# Patient Record
Sex: Female | Born: 1970 | Race: White | Hispanic: No | Smoking: Current every day smoker
Health system: Southern US, Community
[De-identification: ages and names within clinical notes are randomized; demographics above are authoritative.]

## PROBLEM LIST (undated history)

## (undated) DIAGNOSIS — F209 Schizophrenia, unspecified: Secondary | ICD-10-CM

## (undated) DIAGNOSIS — L0292 Furuncle, unspecified: Secondary | ICD-10-CM

## (undated) DIAGNOSIS — M5430 Sciatica, unspecified side: Secondary | ICD-10-CM

## (undated) DIAGNOSIS — F419 Anxiety disorder, unspecified: Secondary | ICD-10-CM

## (undated) DIAGNOSIS — F319 Bipolar disorder, unspecified: Secondary | ICD-10-CM

## (undated) DIAGNOSIS — L0293 Carbuncle, unspecified: Secondary | ICD-10-CM

## (undated) DIAGNOSIS — F32A Depression, unspecified: Secondary | ICD-10-CM

## (undated) DIAGNOSIS — F329 Major depressive disorder, single episode, unspecified: Secondary | ICD-10-CM

## (undated) DIAGNOSIS — G4700 Insomnia, unspecified: Secondary | ICD-10-CM

## (undated) DIAGNOSIS — E079 Disorder of thyroid, unspecified: Secondary | ICD-10-CM

## (undated) HISTORY — PX: TUBAL LIGATION: SHX77

---

## 2000-06-06 ENCOUNTER — Emergency Department (HOSPITAL_COMMUNITY): Admission: EM | Admit: 2000-06-06 | Discharge: 2000-06-07 | Payer: Self-pay | Admitting: Emergency Medicine

## 2001-10-28 ENCOUNTER — Inpatient Hospital Stay (HOSPITAL_COMMUNITY): Admission: EM | Admit: 2001-10-28 | Discharge: 2001-10-30 | Payer: Self-pay | Admitting: Emergency Medicine

## 2001-12-03 ENCOUNTER — Ambulatory Visit (HOSPITAL_COMMUNITY): Admission: RE | Admit: 2001-12-03 | Discharge: 2001-12-03 | Payer: Self-pay | Admitting: Obstetrics & Gynecology

## 2004-02-13 ENCOUNTER — Emergency Department (HOSPITAL_COMMUNITY): Admission: EM | Admit: 2004-02-13 | Discharge: 2004-02-13 | Payer: Self-pay | Admitting: Emergency Medicine

## 2004-03-17 ENCOUNTER — Emergency Department (HOSPITAL_COMMUNITY): Admission: EM | Admit: 2004-03-17 | Discharge: 2004-03-17 | Payer: Self-pay | Admitting: Emergency Medicine

## 2004-05-08 ENCOUNTER — Emergency Department (HOSPITAL_COMMUNITY): Admission: EM | Admit: 2004-05-08 | Discharge: 2004-05-08 | Payer: Self-pay | Admitting: Emergency Medicine

## 2004-08-06 ENCOUNTER — Emergency Department (HOSPITAL_COMMUNITY): Admission: EM | Admit: 2004-08-06 | Discharge: 2004-08-06 | Payer: Self-pay | Admitting: Emergency Medicine

## 2004-12-01 ENCOUNTER — Emergency Department (HOSPITAL_COMMUNITY): Admission: EM | Admit: 2004-12-01 | Discharge: 2004-12-01 | Payer: Self-pay | Admitting: Emergency Medicine

## 2005-02-16 ENCOUNTER — Emergency Department (HOSPITAL_COMMUNITY): Admission: EM | Admit: 2005-02-16 | Discharge: 2005-02-16 | Payer: Self-pay | Admitting: Emergency Medicine

## 2005-03-07 ENCOUNTER — Emergency Department (HOSPITAL_COMMUNITY): Admission: EM | Admit: 2005-03-07 | Discharge: 2005-03-07 | Payer: Self-pay | Admitting: Emergency Medicine

## 2006-12-08 ENCOUNTER — Emergency Department (HOSPITAL_COMMUNITY): Admission: EM | Admit: 2006-12-08 | Discharge: 2006-12-08 | Payer: Self-pay | Admitting: Emergency Medicine

## 2006-12-18 ENCOUNTER — Emergency Department (HOSPITAL_COMMUNITY): Admission: EM | Admit: 2006-12-18 | Discharge: 2006-12-18 | Payer: Self-pay | Admitting: Emergency Medicine

## 2007-01-13 ENCOUNTER — Emergency Department (HOSPITAL_COMMUNITY): Admission: EM | Admit: 2007-01-13 | Discharge: 2007-01-13 | Payer: Self-pay | Admitting: Emergency Medicine

## 2009-09-05 ENCOUNTER — Emergency Department (HOSPITAL_COMMUNITY): Admission: EM | Admit: 2009-09-05 | Discharge: 2009-09-05 | Payer: Self-pay | Admitting: Emergency Medicine

## 2010-07-06 NOTE — H&P (Signed)
   NAME:  Natalie Moore, Natalie Moore                               ACCOUNT NO.:  [id number]   MEDICAL RECORD NO.:  [id number]                   PATIENT TYPE:  OIB   LOCATION:  A415                                 FACILITY:  [facility]   PHYSICIAN:  Tilda Burrow, M.D.              DATE OF BIRTH:  [birth year]   DATE OF ADMISSION:  10/28/2001  DATE OF DISCHARGE:                                HISTORY & PHYSICAL   HISTORY OF PRESENT ILLNESS:  The patient is a 40 year old gravida 6, para 3,  abortus 2, who is at 40 weeks 3 days, who presented early this morning with  contractions.  They have now become regular, two to four minutes apart, and  are still mild, with maternal discomfort but no cervical change.   Prenatal care has been limited due to transportation difficulties.  Blood  type is A negative.  Rubella is pending.  Hepatitis B surface antigen is  negative.  HIV is negative.  GC and Chlamydia are pending.  GBS is negative.  Twenty-eight week hemoglobin was 10.7.  One-hour glucose tolerance was  within normal limits.   PAST MEDICAL HISTORY:  Positive for migraines.   PAST SURGICAL HISTORY:  Positive for a D&C and a laparoscope.   ALLERGIES:  She has an allergy to CODEINE, it makes her nauseous.   SOCIAL HISTORY:  She is single.  She lives with her parents.   PHYSICAL EXAMINATION:  VITAL SIGNS:  Stable.  PELVIC:  Cervix is 1 cm, about 60% effaced, and -2 station.   PLAN:  We are going to admit and observe and if no change in cervix by later  on in the morning, will augment her labor.      Zerita Boers, Reita Cliche, M.D.    DL/MEDQ  D:  64/40/3474  T:  10/28/2001  Job:  [id number]

## 2010-07-06 NOTE — H&P (Signed)
   [name], Natalie Moore                               ACCOUNT NO.:  [id number]   MEDICAL RECORD NO.:  [id number]                   PATIENT TYPE:  INP   LOCATION:  A416                                 FACILITY:  [facility]   PHYSICIAN:  Tilda Burrow, M.D.              DATE OF BIRTH:  29-Jun-1970   DATE OF ADMISSION:  10/28/2001  DATE OF DISCHARGE:                                HISTORY & PHYSICAL   DATE OF DELIVERY:  October 28, 2001 at 1355 p.m.   ONSET OF LABOR:  October 28, 2001 at 12 p.m.   LENGTH OF FIRST STAGE LABOR:  One hour and 50 minutes.   LENGTH OF SECOND STAGE LABOR:  Five minutes.   LENGTH OF THIRD STAGE LABOR:  Six minutes.   DELIVERY NOTE:  The patient had a normal spontaneous delivery of a viable  female infant.  Upon delivery of head there was a large gush of light  meconium fluid.  Infant was thoroughly suctioned and had spontaneous  delivery with good tone, strong cry, vigorous movement of all extremities.  Infant was suctioned, again, thoroughly and dried.  Cord clamped and infant  placed on mother's abdomen in good condition.  Cord blood gas and cord blood  was obtained and sent to laboratory.  Upon inspection perineum was noted to  be intact.  Pitocin 20 units and 1000 cc of D5 LR was used to manage third  stage of labor.  Placenta was delivered spontaneously via Schultze mechanism  with a three vessel cord noted upon inspection.  Membranes were stained  yellow and placenta was sent to pathology for inspection.  Estimated blood  loss was approximately 350 cc.  Infant and mother tolerated delivery well.  Both stabilized and transferred out to the postpartum unit in good  condition.     Zerita Boers, C.N.M.                    Tilda Burrow, M.D.    DL/MEDQ  D:  45/40/9811  T:  10/28/2001  Job:  [id number]   cc:   [facility]

## 2010-07-06 NOTE — Op Note (Signed)
   NAME:  Natalie Moore, Natalie Moore                               ACCOUNT NO.:  [id number]   MEDICAL RECORD NO.:  [id number]                   PATIENT TYPE:  AMB   LOCATION:  DAY                                  FACILITY:  [facility]   PHYSICIAN:  Lazaro Arms, M.D.                DATE OF BIRTH:  11/22/1970   DATE OF PROCEDURE:  DATE OF DISCHARGE:                                 OPERATIVE REPORT   PREOPERATIVE DIAGNOSIS:  Multiparous female desires sterilization.   POSTOPERATIVE DIAGNOSIS:  Multiparous female desires sterilization.   PROCEDURE:  Laparoscopic bilateral tubal ligation using electrocautery.   SURGEON:  Lazaro Arms, M.D.   ANESTHESIA:  General endotracheal   FINDINGS:  The patient had a normal uterus, tubes, and ovaries, normal  pelvis.   DESCRIPTION OF PROCEDURE:  The patient was taken to the operating room and  placed in the supine position where she underwent general endotracheal  anesthesia.  She was then prepped and draped in the usual sterile fashion.  She was then placed in dorsal lithotomy position; Hulka tenaculum was  placed; and her bladder was drained.  An incision made under the umbilicus  and dissected down to the fascia which was incised sharply; then dissected  to the peritoneum and this was opened sharply under direct visualization  without difficulty.   Then an 11-mm trocar was placed under direct visualization with no  obturator, just the trocar, into the peritoneal cavity.  The peritoneal  cavity was insufflated and confirmed with the video laparoscope.  The tubes  were identified and the distal end burned to the ampullary region of the  tube, an approximately 3-cm segment bilaterally, with no resistance and  beyond using the electrocautery unit.  The pelvis was otherwise normal, as  stated above.  There was good hemostasis.   The instruments were removed.  The gas was allowed to escape.  The fascia  was closed with a single #0 Vicryl suture.  The subcutaneous  tissue was  closed with 3-0 Vicryl and Dermabond was placed.  Marcaine 1/2% with  1:200,000 epinephrine was injected at the surgical site for postoperative  pain relief.  The patient was awakened from anesthesia taken to the recovery  room in good stable condition.  All counts were correct.                                               Lazaro Arms, M.D.    Natalie Moore  D:  12/03/2001  T:  12/03/2001  Job:  [id number]

## 2013-02-18 DIAGNOSIS — F319 Bipolar disorder, unspecified: Secondary | ICD-10-CM

## 2013-02-18 DIAGNOSIS — F209 Schizophrenia, unspecified: Secondary | ICD-10-CM

## 2013-02-18 HISTORY — DX: Schizophrenia, unspecified: F20.9

## 2013-02-18 HISTORY — DX: Bipolar disorder, unspecified: F31.9

## 2013-12-03 ENCOUNTER — Emergency Department (HOSPITAL_COMMUNITY)
Admission: EM | Admit: 2013-12-03 | Discharge: 2013-12-03 | Disposition: A | Discharge status: Home / Self Care | Payer: Self-pay | Attending: Emergency Medicine | Admitting: Emergency Medicine

## 2013-12-03 ENCOUNTER — Encounter (HOSPITAL_COMMUNITY): Payer: Self-pay | Admitting: Emergency Medicine

## 2013-12-03 DIAGNOSIS — Z720 Tobacco use: Secondary | ICD-10-CM | POA: Insufficient documentation

## 2013-12-03 DIAGNOSIS — L02214 Cutaneous abscess of groin: Secondary | ICD-10-CM | POA: Insufficient documentation

## 2013-12-03 DIAGNOSIS — Z9851 Tubal ligation status: Secondary | ICD-10-CM | POA: Insufficient documentation

## 2013-12-03 HISTORY — DX: Furuncle, unspecified: L02.92

## 2013-12-03 HISTORY — DX: Carbuncle, unspecified: L02.93

## 2013-12-03 MED ORDER — SULFAMETHOXAZOLE-TRIMETHOPRIM 800-160 MG PO TABS: 1.0000 | ORAL_TABLET | Freq: Two times a day (BID) | ORAL | Status: AC

## 2013-12-03 MED ORDER — HYDROCODONE-ACETAMINOPHEN 5-325 MG PO TABS: 1.0000 | ORAL_TABLET | ORAL | Status: AC | PRN

## 2013-12-03 MED ORDER — FLUCONAZOLE 150 MG PO TABS: 150.0000 mg | ORAL_TABLET | Freq: Every day | ORAL | Status: AC

## 2013-12-03 NOTE — ED Notes (Signed)
Pt reports right groin "boil" x3 days. Pt reports history of same. Pt denies any fever,nausea. nad noted.

## 2013-12-03 NOTE — ED Provider Notes (Signed)
CSN: [id number]     Arrival date & time 12/03/13  1537 History   First MD Initiated Contact with Patient 12/03/13 1609     Chief Complaint  Patient presents with  . Recurrent Skin Infections     (Consider location/radiation/quality/duration/timing/severity/associated sxs/prior Treatment) Patient is a 43 y.o. female presenting with abscess. The history is provided by the patient.  Abscess Location:  Ano-genital Ano-genital abscess location:  Groin Abscess quality: painful, redness and warmth   Red streaking: no   Duration:  3 days Progression:  Worsening Pain details:    Quality:  Throbbing  Natalie Moore is a 43 y.o. female who presents to the ED with an abscess to the right groin that started about 5 days days ago. The area became painful 3 days ago. She sat in warm water today without relief. She has been taking ibuprofen 800 mg but it only helps for a while. She has had boils in the past that have had to be drained. She denies fever, n/v or other problems.   Past Medical History  Diagnosis Date  . Recurrent boils    Past Surgical History  Procedure Laterality Date  . Tubal ligation     History reviewed. No pertinent family history. History  Substance Use Topics  . Smoking status: Current Every Day Smoker -- 1.00 packs/day  . Smokeless tobacco: Not on file  . Alcohol Use: No   OB History   Grav Para Term Preterm Abortions TAB SAB Ect Mult Living                 Review of Systems Negative except as stated in HPI   Allergies  Review of patient's allergies indicates not on file.  Home Medications   Prior to Admission medications   Not on File   BP 117/68  Pulse 95  Temp(Src) 98.2 F (36.8 C) (Oral)  Resp 18  Ht 5\' 3"  (1.6 m)  Wt 178 lb (80.74 kg)  BMI 31.54 kg/m2  SpO2 99%  LMP 12/02/2013 Physical Exam  Nursing note and vitals reviewed. Constitutional: She is oriented to person, place, and time. She appears well-developed and well-nourished. No  distress.  HENT:  Head: Normocephalic.  Eyes: EOM are normal.  Neck: Neck supple.  Cardiovascular: Normal rate.   Pulmonary/Chest: Effort normal.  Abdominal:    Raised tender area right inguinal area with erythema, no streaking.   Musculoskeletal: Normal range of motion.  Neurological: She is alert and oriented to person, place, and time. No cranial nerve deficit.  Skin: Skin is warm and dry.  Psychiatric: She has a normal mood and affect. Her behavior is normal.    ED Course  Procedures (including critical care time) Labs Review  MDM  43 y.o. female with small draining abscess to the right inguinal area. She will continue to apply warm wet compresses to the area. Will start antibiotics and pain medication and she will return if symptoms worsen. Stable for discharge without fever or red streaking. Discussed with the patient and all questioned fully answered.    Medication List         fluconazole 150 MG tablet  Commonly known as:  DIFLUCAN  Take 1 tablet (150 mg total) by mouth daily.     HYDROcodone-acetaminophen 5-325 MG per tablet  Commonly known as:  NORCO/VICODIN  Take 1 tablet by mouth every 4 (four) hours as needed.     sulfamethoxazole-trimethoprim 800-160 MG per tablet  Commonly known as:  BACTRIM DS,SEPTRA DS  Take 1 tablet by mouth 2 (two) times daily.            Janne NapoleonHope M Margurete Guaman, NP 12/03/13 2013

## 2013-12-03 NOTE — Discharge Instructions (Signed)
Abscess °An abscess (boil or furuncle) is an infected area on or under the skin. This area is filled with yellowish-white fluid (pus) and other material (debris). °HOME CARE  °· Only take medicines as told by your doctor. °· If you were given antibiotic medicine, take it as directed. Finish the medicine even if you start to feel better. °· If gauze is used, follow your doctor's directions for changing the gauze. °· To avoid spreading the infection: °¨ Keep your abscess covered with a bandage. °¨ Wash your hands well. °¨ Do not share personal care items, towels, or whirlpools with others. °¨ Avoid skin contact with others. °· Keep your skin and clothes clean around the abscess. °· Keep all doctor visits as told. °GET HELP RIGHT AWAY IF:  °· You have more pain, puffiness (swelling), or redness in the wound site. °· You have more fluid or blood coming from the wound site. °· You have muscle aches, chills, or you feel sick. °· You have a fever. °MAKE SURE YOU:  °· Understand these instructions. °· Will watch your condition. °· Will get help right away if you are not doing well or get worse. °Document Released: 07/24/2007 Document Revised: 08/06/2011 Document Reviewed: 04/19/2011 °ExitCare® Patient Information ©2015 [employer]. This information is not intended to replace advice given to you by your health care provider. Make sure you discuss any questions you have with your health care provider. ° °

## 2013-12-03 NOTE — ED Provider Notes (Signed)
Medical screening examination/treatment/procedure(s) were performed by non-physician practitioner and as supervising physician I was immediately available for consultation/collaboration.   EKG Interpretation None        Glynn OctaveStephen Hartley Urton, MD 12/03/13 2104

## 2015-01-07 ENCOUNTER — Emergency Department (HOSPITAL_COMMUNITY)
Admission: EM | Admit: 2015-01-07 | Discharge: 2015-01-07 | Discharge status: Against Medical Advice | Payer: Self-pay | Attending: Emergency Medicine | Admitting: Emergency Medicine

## 2015-01-07 ENCOUNTER — Encounter (HOSPITAL_COMMUNITY): Payer: Self-pay | Admitting: *Deleted

## 2015-01-07 DIAGNOSIS — M79661 Pain in right lower leg: Secondary | ICD-10-CM | POA: Insufficient documentation

## 2015-01-07 DIAGNOSIS — Z872 Personal history of diseases of the skin and subcutaneous tissue: Secondary | ICD-10-CM | POA: Insufficient documentation

## 2015-01-07 DIAGNOSIS — M79604 Pain in right leg: Secondary | ICD-10-CM

## 2015-01-07 DIAGNOSIS — F419 Anxiety disorder, unspecified: Secondary | ICD-10-CM | POA: Insufficient documentation

## 2015-01-07 DIAGNOSIS — F17200 Nicotine dependence, unspecified, uncomplicated: Secondary | ICD-10-CM | POA: Insufficient documentation

## 2015-01-07 DIAGNOSIS — Z79899 Other long term (current) drug therapy: Secondary | ICD-10-CM | POA: Insufficient documentation

## 2015-01-07 HISTORY — DX: Anxiety disorder, unspecified: F41.9

## 2015-01-07 LAB — TSH: TSH: 1.744 u[IU]/mL (ref 0.350–4.500)

## 2015-01-07 LAB — BASIC METABOLIC PANEL
Anion gap: 8 (ref 5–15)
BUN: 8 mg/dL (ref 6–20)
CHLORIDE: 101 mmol/L (ref 101–111)
CO2: 28 mmol/L (ref 22–32)
Calcium: 9 mg/dL (ref 8.9–10.3)
Creatinine, Ser: 0.93 mg/dL (ref 0.44–1.00)
GFR calc Af Amer: >60 mL/min (ref >60)
GFR calc non Af Amer: >60 mL/min (ref >60)
GLUCOSE: 68 mg/dL (ref 65–99)
POTASSIUM: 3.8 mmol/L (ref 3.5–5.1)
SODIUM: 137 mmol/L (ref 135–145)

## 2015-01-07 MED ORDER — KETOROLAC TROMETHAMINE 60 MG/2ML IM SOLN
60.0000 mg | Freq: Once | INTRAMUSCULAR | Status: AC
  Administered 2015-01-07: 60 mg via INTRAMUSCULAR
  Filled 2015-01-07: qty 2

## 2015-01-07 MED ORDER — DIAZEPAM 2 MG PO TABS
2.0000 mg | ORAL_TABLET | Freq: Once | ORAL | Status: AC
  Administered 2015-01-07: 2 mg via ORAL
  Filled 2015-01-07: qty 1

## 2015-01-07 MED ORDER — LORAZEPAM 1 MG PO TABS: 1.0000 mg | ORAL_TABLET | Freq: Once | ORAL | Status: DC

## 2015-01-07 NOTE — ED Provider Notes (Signed)
CSN: [id number]     Arrival date & time 01/07/15  1201 History   First MD Initiated Contact with Patient 01/07/15 1229     Chief Complaint  Patient presents with  . Anxiety  . Leg Pain     (Consider location/radiation/quality/duration/timing/severity/associated sxs/prior Treatment) Patient is a 44 y.o. female presenting with anxiety and leg pain. The history is provided by the patient.  Anxiety This is a recurrent problem. The current episode started 3 to 5 hours ago. The problem occurs constantly. The problem has been gradually improving. Associated symptoms include chest pain and shortness of breath. Nothing relieves the symptoms. She has tried nothing for the symptoms. The treatment provided no relief.  Leg Pain Location:  Leg Leg location:  R lower leg Pain details:    Quality:  Aching and cramping   Radiates to:  Does not radiate   Timing:  Intermittent   Progression:  Waxing and waning Chronicity:  Recurrent Prior injury to area:  No Relieved by:  None tried   Past Medical History  Diagnosis Date  . Recurrent boils   . Anxiety    Past Surgical History  Procedure Laterality Date  . Tubal ligation     No family history on file. Social History  Substance Use Topics  . Smoking status: Current Every Day Smoker -- 1.00 packs/day  . Smokeless tobacco: None  . Alcohol Use: No   OB History    No data available     Review of Systems  Respiratory: Positive for shortness of breath. Negative for cough.   Cardiovascular: Positive for chest pain. Negative for palpitations and leg swelling.  Musculoskeletal: Negative for joint swelling and gait problem.  Skin: Negative for rash.  Psychiatric/Behavioral: Positive for agitation. Negative for suicidal ideas.  All other systems reviewed and are negative.     Allergies  Bactrim  Home Medications   Prior to Admission medications   Medication Sig Start Date End Date Taking? Authorizing Provider  fluconazole (DIFLUCAN)  150 MG tablet Take 1 tablet (150 mg total) by mouth daily. 12/03/13   Hope Orlene Och, NP  HYDROcodone-acetaminophen (NORCO/VICODIN) 5-325 MG per tablet Take 1 tablet by mouth every 4 (four) hours as needed. 12/03/13   Hope Orlene Och, NP   BP 96/58 mmHg  Pulse 73  Temp(Src) 97.5 F (36.4 C) (Oral)  Resp 16  Ht  (1.6 m)  Wt 185 lb (83.915 kg)  BMI 32.78 kg/m2  SpO2 99%  LMP 01/02/2015 Physical Exam  Constitutional: She appears well-developed and well-nourished.  HENT:  Head: Normocephalic and atraumatic.  Neck: Normal range of motion.  Cardiovascular: Normal rate and regular rhythm.   Pulmonary/Chest: No stridor. No respiratory distress.  Abdominal: She exhibits no distension.  Musculoskeletal: Normal range of motion. She exhibits no edema (no lower extremity edema) or tenderness (slightly around achilles tendon with dorsiflexion, no calf ttp, no tenderness with plantarflexion).  Neurological: She is alert.  Nursing note and vitals reviewed.   ED Course  Procedures (including critical care time) Labs Review Labs Reviewed  BASIC METABOLIC PANEL  TSH    Imaging Review No results found. I have personally reviewed and evaluated these images and lab results as part of my medical decision-making.   EKG Interpretation   Date/Time:  Saturday January 07 2015 13:00:54 EST Ventricular Rate:  62 PR Interval:  162 QRS Duration: 85 QT Interval:  433 QTC Calculation: 440 R Axis:   38 Text Interpretation:  Sinus rhythm Low voltage, precordial  leads  Borderline T abnormalities, anterior leads Baseline wander in lead(s) V1  Confirmed by [facility] Hospital[facility] MD, Barbara CowerJASON [phone] on 01/07/2015 1:20:56 PM      MDM   Final diagnoses:  Anxiety  Pain of right lower extremity   Doubt DVT. More likely muscular. Will check bmp, tsh and give a dose of valium to help. Also with anxiety similar to previous anxiety attacks. No new/different symptoms. Valium should help that as well.   Patient left  after receiving medicine apparently to take care of children. No emergent causes suspected, I will call her if labs are awry.     Marily MemosJason Jessalyn Hinojosa, MD 01/07/15 [phone]

## 2015-01-07 NOTE — ED Notes (Signed)
Pt left informed nursing staff she needs to get back home to her kids. Pt did not want to wait for lab results. Pt in no acute distress.

## 2015-01-07 NOTE — ED Notes (Signed)
Pt states she became upset this morning and had an anxiety attack. On Buspar for anxiety, per pt. Pt states pain to entire right leg which began 3 weeks ago.

## 2015-01-07 NOTE — ED Notes (Signed)
Patient to nursing station stating shes "leaving because I have a house full of children, tell my nurse".

## 2015-03-29 DIAGNOSIS — Z139 Encounter for screening, unspecified: Secondary | ICD-10-CM

## 2015-04-25 ENCOUNTER — Encounter (HOSPITAL_COMMUNITY): Payer: Self-pay | Admitting: Emergency Medicine

## 2015-04-25 ENCOUNTER — Emergency Department (HOSPITAL_COMMUNITY)
Admission: EM | Admit: 2015-04-25 | Discharge: 2015-04-25 | Disposition: A | Discharge status: Home / Self Care | Payer: Self-pay | Attending: Emergency Medicine | Admitting: Emergency Medicine

## 2015-04-25 DIAGNOSIS — F17200 Nicotine dependence, unspecified, uncomplicated: Secondary | ICD-10-CM | POA: Insufficient documentation

## 2015-04-25 DIAGNOSIS — M79604 Pain in right leg: Secondary | ICD-10-CM | POA: Insufficient documentation

## 2015-04-25 DIAGNOSIS — Z9851 Tubal ligation status: Secondary | ICD-10-CM | POA: Insufficient documentation

## 2015-04-25 DIAGNOSIS — M5431 Sciatica, right side: Secondary | ICD-10-CM

## 2015-04-25 DIAGNOSIS — M5441 Lumbago with sciatica, right side: Secondary | ICD-10-CM | POA: Insufficient documentation

## 2015-04-25 MED ORDER — OXYCODONE-ACETAMINOPHEN 5-325 MG PO TABS
1.0000 | ORAL_TABLET | Freq: Once | ORAL | Status: AC
  Administered 2015-04-25: 1 via ORAL
  Filled 2015-04-25: qty 1

## 2015-04-25 MED ORDER — CYCLOBENZAPRINE HCL 10 MG PO TABS: 10.0000 mg | ORAL_TABLET | Freq: Three times a day (TID) | ORAL | Status: AC | PRN

## 2015-04-25 MED ORDER — OXYCODONE-ACETAMINOPHEN 5-325 MG PO TABS: 1.0000 | ORAL_TABLET | ORAL | Status: AC | PRN

## 2015-04-25 MED ORDER — PREDNISONE 10 MG PO TABS: ORAL_TABLET | ORAL | Status: DC

## 2015-04-25 NOTE — ED Notes (Signed)
Pt states she has been having right leg pain for 2 weeks.  States it hurts from her hip down.

## 2015-04-25 NOTE — Discharge Instructions (Signed)
°  Sciatica °Sciatica is pain, weakness, numbness, or tingling along your sciatic nerve. The nerve starts in the lower back and runs down the back of each leg. Nerve damage or certain conditions pinch or put pressure on the sciatic nerve. This causes the pain, weakness, and other discomforts of sciatica. °HOME CARE  °· Only take medicine as told by your doctor. °· Apply ice to the affected area for 20 minutes. Do this 3-4 times a day for the first 48-72 hours. Then try heat in the same way. °· Exercise, stretch, or do your usual activities if these do not make your pain worse. °· Go to physical therapy as told by your doctor. °· Keep all doctor visits as told. °· Do not wear high heels or shoes that are not supportive. °· Get a firm mattress if your mattress is too soft to lessen pain and discomfort. °GET HELP RIGHT AWAY IF:  °· You cannot control when you poop (bowel movement) or pee (urinate). °· You have more weakness in your lower back, lower belly (pelvis), butt (buttocks), or legs. °· You have redness or puffiness (swelling) of your back. °· You have a burning feeling when you pee. °· You have pain that gets worse when you lie down. °· You have pain that wakes you from your sleep. °· Your pain is worse than past pain. °· Your pain lasts longer than 4 weeks. °· You are suddenly losing weight without reason. °MAKE SURE YOU:  °· Understand these instructions. °· Will watch this condition. °· Will get help right away if you are not doing well or get worse. °  °This information is not intended to replace advice given to you by your health care provider. Make sure you discuss any questions you have with your health care provider. °  °Document Released: 11/14/2007 Document Revised: 10/26/2014 Document Reviewed: 06/16/2011 °Elsevier Interactive Patient Education ©2016 [employer] ° ° °

## 2015-04-28 NOTE — ED Provider Notes (Signed)
CSN: [id number]     Arrival date & time 04/25/15  1722 History   First MD Initiated Contact with Patient 04/25/15 1751     Chief Complaint  Patient presents with  . Leg Pain     (Consider location/radiation/quality/duration/timing/severity/associated sxs/prior Treatment) HPI   Natalie Moore is a 45 y.o. female who presents to the Emergency Department complaining of right sided low back pain for 2 weeks.  She describes a sharp stabbing pai radiating from her right low back into her right thigh to the level of her knee.  Pain is worse worse with movement, improves slightly with rest.  She denies known injury.  She has tried OTC analgesics without relief.  She denies abd pain, fever, chills, urine or bowel changes, numbness or weakness of the LE's.  Past Medical History  Diagnosis Date  . Recurrent boils   . Anxiety    Past Surgical History  Procedure Laterality Date  . Tubal ligation     History reviewed. No pertinent family history. Social History  Substance Use Topics  . Smoking status: Current Every Day Smoker -- 1.00 packs/day  . Smokeless tobacco: None  . Alcohol Use: No   OB History    No data available     Review of Systems  Constitutional: Negative for fever.  Respiratory: Negative for shortness of breath.   Gastrointestinal: Negative for vomiting, abdominal pain and constipation.  Genitourinary: Negative for dysuria, hematuria, flank pain, decreased urine volume and difficulty urinating.  Musculoskeletal: Positive for back pain. Negative for joint swelling.  Skin: Negative for rash.  Neurological: Negative for weakness and numbness.  All other systems reviewed and are negative.     Allergies  Bactrim  Home Medications   Prior to Admission medications   Medication Sig Start Date End Date Taking? Authorizing Provider  cyclobenzaprine (FLEXERIL) 10 MG tablet Take 1 tablet (10 mg total) by mouth 3 (three) times daily as needed. 04/25/15   Ajaya Crutchfield, PA-C   fluconazole (DIFLUCAN) 150 MG tablet Take 1 tablet (150 mg total) by mouth daily. 12/03/13   Hope Orlene Och, NP  HYDROcodone-acetaminophen (NORCO/VICODIN) 5-325 MG per tablet Take 1 tablet by mouth every 4 (four) hours as needed. 12/03/13   Hope Orlene Och, NP  oxyCODONE-acetaminophen (PERCOCET/ROXICET) 5-325 MG tablet Take 1 tablet by mouth every 4 (four) hours as needed. 04/25/15   Adam Demary, PA-C  predniSONE (DELTASONE) 10 MG tablet Take 6 tablets day one, 5 tablets day two, 4 tablets day three, 3 tablets day four, 2 tablets day five, then 1 tablet day six 04/25/15   Nataline Basara, PA-C   BP 123/69 mmHg  Pulse 88  Temp(Src) 98.2 F (36.8 C) (Oral)  Resp 16  Ht  (1.6 m)  Wt 83.462 kg  BMI 32.60 kg/m2  SpO2 100% Physical Exam  Constitutional: She is oriented to person, place, and time. She appears well-developed and well-nourished. No distress.  HENT:  Head: Normocephalic and atraumatic.  Neck: Normal range of motion. Neck supple.  Cardiovascular: Normal rate, regular rhythm, normal heart sounds and intact distal pulses.   No murmur heard. Pulmonary/Chest: Effort normal and breath sounds normal. No respiratory distress.  Abdominal: Soft. She exhibits no distension. There is no tenderness.  Musculoskeletal: She exhibits tenderness. She exhibits no edema.       Lumbar back: She exhibits tenderness and pain. She exhibits normal range of motion, no swelling, no deformity, no laceration and normal pulse.  ttp of the right lumbar paraspinal  muscles.  No spinal tenderness.  DP pulses are brisk and symmetrical.  Distal sensation intact.  Pt has 5/5 strength against resistance of bilateral lower extremities.     Neurological: She is alert and oriented to person, place, and time. She has normal strength. No sensory deficit. She exhibits normal muscle tone. Coordination and gait normal.  Reflex Scores:      Patellar reflexes are 2+ on the right side and 2+ on the left side.      Achilles  reflexes are 2+ on the right side and 2+ on the left side. Skin: Skin is warm and dry. No rash noted.  Nursing note and vitals reviewed.   ED Course  Procedures (including critical care time) Labs Review Labs Reviewed - No data to display  Imaging Review No results found. I have personally reviewed and evaluated these images and lab results as part of my medical decision-making.   EKG Interpretation None      MDM   Final diagnoses:  Sciatica of right side    Pt well appearing, no focal neuro deficits.  No concerning sx's for emergent neurological process.  Pt agrees to symptomatic tx and close PMD f/u if needed   Pauline Ausammy Bannon Giammarco, PA-C 04/28/15 1312  Zadie Rhineonald Wickline, MD 04/28/15 1356

## 2015-05-07 ENCOUNTER — Encounter (HOSPITAL_COMMUNITY): Payer: Self-pay | Admitting: *Deleted

## 2015-05-07 ENCOUNTER — Emergency Department (HOSPITAL_COMMUNITY)
Admission: EM | Admit: 2015-05-07 | Discharge: 2015-05-07 | Discharge status: Against Medical Advice | Payer: Self-pay | Attending: Emergency Medicine | Admitting: Emergency Medicine

## 2015-05-07 DIAGNOSIS — M5431 Sciatica, right side: Secondary | ICD-10-CM

## 2015-05-07 DIAGNOSIS — M5441 Lumbago with sciatica, right side: Secondary | ICD-10-CM | POA: Insufficient documentation

## 2015-05-07 DIAGNOSIS — F17210 Nicotine dependence, cigarettes, uncomplicated: Secondary | ICD-10-CM | POA: Insufficient documentation

## 2015-05-07 HISTORY — DX: Sciatica, unspecified side: M54.30

## 2015-05-07 NOTE — ED Notes (Signed)
Patient left examination room yelling at provider. Patient ambulatory with no obvious problems walking left AMA.

## 2015-05-07 NOTE — ED Provider Notes (Signed)
History  By signing my name below, I, Natalie Moore, attest that this documentation has been prepared under the direction and in the presence of Natalie Moore, [place]. Electronically Signed: Karle Moore, ED Scribe. 05/07/2015. 12:09 PM.  Chief Complaint  Patient presents with  . Leg Pain   The history is provided by the patient and medical records. No language interpreter was used.    HPI Comments:  Natalie Moore is a 45 y.o. female with PMHx of sciatica who presents to the Emergency Department complaining of lower right-sided back pain that has gotten worse over the past couple of days. She reports the pain radiates down into her right leg. She reports having an appt with a specialist in [place], [place] next month (06/03/15). She states she has been taking Ibuprofen and Flexeril with minimal relief of the pain. Moving increases the pain. She denies alleviating factors. She denies numbness, tingling or weakness of the lower extremities, bowel or bladder incontinence, fever, chills, nausea, vomiting, saddle anesthesia, bruising or wounds. She denies any trauma, injury or fall. She is a smoker. Pt was seen here 12 days ago and received a prescription for Percocet, Prednisone and Flexeril. Pt is ambulatory without issue.  Past Medical History  Diagnosis Date  . Recurrent boils   . Anxiety   . Sciatica    Past Surgical History  Procedure Laterality Date  . Tubal ligation     No family history on file. Social History  Substance Use Topics  . Smoking status: Current Every Day Smoker -- 1.00 packs/day    Types: Cigarettes  . Smokeless tobacco: None  . Alcohol Use: No   OB History    No data available     Review of Systems  Constitutional: Negative for fever and chills.  Gastrointestinal: Negative for nausea and vomiting.  Genitourinary:       No bowel or bladder incontinence  Musculoskeletal: Positive for myalgias and back pain.  Skin: Negative for color change and wound.   Neurological: Negative for weakness and numbness.    Allergies  Bactrim  Home Medications   Prior to Admission medications   Medication Sig Start Date End Date Taking? Authorizing Provider  cyclobenzaprine (FLEXERIL) 10 MG tablet Take 1 tablet (10 mg total) by mouth 3 (three) times daily as needed. 04/25/15   Tammy Triplett, PA-C  fluconazole (DIFLUCAN) 150 MG tablet Take 1 tablet (150 mg total) by mouth daily. 12/03/13   Hope Orlene Och, NP  HYDROcodone-acetaminophen (NORCO/VICODIN) 5-325 MG per tablet Take 1 tablet by mouth every 4 (four) hours as needed. 12/03/13   Hope Orlene Och, NP  oxyCODONE-acetaminophen (PERCOCET/ROXICET) 5-325 MG tablet Take 1 tablet by mouth every 4 (four) hours as needed. 04/25/15   Tammy Triplett, PA-C  predniSONE (DELTASONE) 10 MG tablet Take 6 tablets day one, 5 tablets day two, 4 tablets day three, 3 tablets day four, 2 tablets day five, then 1 tablet day six 04/25/15   Tammy Triplett, PA-C   Triage Vitals: BP 127/73 mmHg  Pulse 106  Temp(Src) 98.5 F (36.9 C) (Oral)  Resp 16  Ht  (1.6 m)  Wt 183 lb (83.008 kg)  BMI 32.43 kg/m2  SpO2 98%  LMP 04/11/2015 Physical Exam  Constitutional: She is oriented to person, place, and time. She appears well-developed and well-nourished.  HENT:  Head: Normocephalic and atraumatic.  Eyes: EOM are normal.  Neck: Normal range of motion.  Cardiovascular: Normal rate.   Pulmonary/Chest: Effort normal.  Musculoskeletal: Normal range of  motion. She exhibits tenderness.  Tender at right sciatic notch. Full ROM. Normal gait.  Neurological: She is alert and oriented to person, place, and time.  Skin: Skin is warm and dry.  Psychiatric: She has a normal mood and affect. Her behavior is normal.  Nursing note and vitals reviewed.   ED Course  Procedures (including critical care time) DIAGNOSTIC STUDIES: Oxygen Saturation is 98% on RA, normal by my interpretation.   COORDINATION OF CARE: 12:01 PM- Will order injection  of Solu-Medrol and Toradol. Pt verbalizes understanding and agrees to plan.  12:06 PM- Pt comes to nurses' station from exam room and asks to speak to provider again. She declined all injections and states she did not take the Prednisone she was prescribed last visit. She requested only Percocet in which she was told that was not the best course of treatment for her pain. Pt gets angry and exits the hospital angrily. Ambulatory without difficulty or assistance.  Medications - No data to display   MDM   Final diagnoses:  Sciatica, right    ama    Elson AreasLeslie K Milbern Doescher, PA-C 05/07/15 1215  Eber HongBrian Miller, MD 05/08/15 [phone]

## 2015-05-07 NOTE — ED Notes (Signed)
Pt states she is having right leg pain. Pt has history of sciatica and states this feels the same. She has been taking flexeril and ibuprofen.

## 2015-10-02 NOTE — Congregational Nurse Program (Signed)
Congregational Nurse Program Note  Date of Encounter: 03/29/2015  Past Medical History: Past Medical History:  Diagnosis Date  . Anxiety   . Recurrent boils   . Sciatica     Encounter Details:   [facility] Complaints of right foot pain and swelling, history of old injury. No noticeable swelling or reddnes at site. [facility] scheduled 04/24/2015 at 3:30.  Pearletha AlfredJan Ravina Milner,RN [facility].

## 2015-10-10 NOTE — Congregational Nurse Program (Signed)
Congregational Nurse Program Note  Date of Encounter: 03/29/2015  Past Medical History: Past Medical History:  Diagnosis Date  . Anxiety   . Recurrent boils   . Sciatica     Encounter Details:  Client [facility] to establish a provider. Complaints today with an old injury to R. Foot, ankel intermittent pain and swelling. Pearletha AlfredJan Jericho Alcorn,RN CNP [phone]

## 2015-10-10 NOTE — Congregational Nurse Program (Signed)
Congregational Nurse Program Note  Date of Encounter: 03/29/2015  Past Medical History: Past Medical History:  Diagnosis Date  . Anxiety   . Recurrent boils   . Sciatica     Encounter Details:   [facility] to establish a doctor. 04/24/15.  Pearletha AlfredJan Suellen Durocher,RN CN  [phone].

## 2015-10-12 NOTE — Congregational Nurse Program (Signed)
Congregational Nurse Program Note  Date of Encounter: 03/29/2015  Past Medical History: Past Medical History:  Diagnosis Date   Anxiety    Recurrent boils    Sciatica     Encounter Details:  Left leg, ankle and foot swelling. Old injury from glass cut. Appointment made for April 24, 2015 at 3:30pm Hedda Sladeatricia Settle, RN [phone]

## 2016-11-26 ENCOUNTER — Encounter (HOSPITAL_COMMUNITY): Payer: Self-pay | Admitting: Emergency Medicine

## 2016-11-26 ENCOUNTER — Emergency Department (HOSPITAL_COMMUNITY): Payer: Self-pay

## 2016-11-26 ENCOUNTER — Emergency Department (HOSPITAL_COMMUNITY)
Admission: EM | Admit: 2016-11-26 | Discharge: 2016-11-26 | Disposition: A | Discharge status: Home / Self Care | Payer: Self-pay | Attending: Emergency Medicine | Admitting: Emergency Medicine

## 2016-11-26 DIAGNOSIS — W108XXS Fall (on) (from) other stairs and steps, sequela: Secondary | ICD-10-CM | POA: Insufficient documentation

## 2016-11-26 DIAGNOSIS — Z79899 Other long term (current) drug therapy: Secondary | ICD-10-CM | POA: Insufficient documentation

## 2016-11-26 DIAGNOSIS — S93439S Sprain of tibiofibular ligament of unspecified ankle, sequela: Secondary | ICD-10-CM | POA: Insufficient documentation

## 2016-11-26 DIAGNOSIS — S93432S Sprain of tibiofibular ligament of left ankle, sequela: Secondary | ICD-10-CM

## 2016-11-26 DIAGNOSIS — R531 Weakness: Secondary | ICD-10-CM | POA: Insufficient documentation

## 2016-11-26 DIAGNOSIS — F17210 Nicotine dependence, cigarettes, uncomplicated: Secondary | ICD-10-CM | POA: Insufficient documentation

## 2016-11-26 DIAGNOSIS — F419 Anxiety disorder, unspecified: Secondary | ICD-10-CM | POA: Insufficient documentation

## 2016-11-26 DIAGNOSIS — S93492S Sprain of other ligament of left ankle, sequela: Secondary | ICD-10-CM

## 2016-11-26 HISTORY — DX: Disorder of thyroid, unspecified: E07.9

## 2016-11-26 HISTORY — DX: Insomnia, unspecified: G47.00

## 2016-11-26 HISTORY — DX: Depression, unspecified: F32.A

## 2016-11-26 HISTORY — DX: Major depressive disorder, single episode, unspecified: F32.9

## 2016-11-26 MED ORDER — DEXAMETHASONE 4 MG PO TABS: 4.0000 mg | ORAL_TABLET | Freq: Two times a day (BID) | ORAL | 0 refills | Status: AC

## 2016-11-26 MED ORDER — NEOMYCIN-POLYMYXIN-HC 3.5-10000-1 OT SUSP
4.0000 [drp] | Freq: Once | OTIC | Status: AC
  Administered 2016-11-26: 4 [drp] via OTIC
  Filled 2016-11-26: qty 10

## 2016-11-26 MED ORDER — LEVOTHYROXINE SODIUM 100 MCG PO TABS: 100.0000 ug | ORAL_TABLET | Freq: Every day | ORAL | 0 refills | Status: AC

## 2016-11-26 MED ORDER — ONDANSETRON HCL 4 MG PO TABS
4.0000 mg | ORAL_TABLET | Freq: Once | ORAL | Status: AC
  Administered 2016-11-26: 4 mg via ORAL
  Filled 2016-11-26: qty 1

## 2016-11-26 MED ORDER — DEXAMETHASONE SODIUM PHOSPHATE 10 MG/ML IJ SOLN
10.0000 mg | Freq: Once | INTRAMUSCULAR | Status: AC
  Administered 2016-11-26: 10 mg via INTRAMUSCULAR
  Filled 2016-11-26: qty 1

## 2016-11-26 MED ORDER — DICLOFENAC SODIUM 75 MG PO TBEC: 75.0000 mg | DELAYED_RELEASE_TABLET | Freq: Two times a day (BID) | ORAL | 0 refills | Status: AC

## 2016-11-26 MED ORDER — KETOROLAC TROMETHAMINE 10 MG PO TABS
10.0000 mg | ORAL_TABLET | Freq: Once | ORAL | Status: AC
  Administered 2016-11-26: 10 mg via ORAL
  Filled 2016-11-26: qty 1

## 2016-11-26 NOTE — ED Provider Notes (Signed)
[facility]-EM[facility]Y DEPT Provider Note   CSN: [id number] Arrival date & time: 11/26/16  0909     History   Chief Complaint Chief Complaint  Patient presents with  . Ankle Pain  . Weakness    HPI Natalie Moore is a 46 y.o. female.  Patient is a 46 year old female who presents to the emergency department with a complaint of left ankle pain as well as weakness.  Patient states that about 2 months ago she slipped on a wet step and twisted her ankle. She was seen at another emergency department where she was x-rayed and was told that she had a bad sprain. She states that 2 months later she still has pain and at times has swelling present. She has been told that she has arthritis, particularly in her knees. She has not had any other injury to the ankle. She states she has pain when she puts weight on the left ankle.  The patient states she has been diagnosed with a low thyroid. She states his been 6 months since she's had any medication because of problems with finances. She was evaluated at the local health department, but states she owes them money and cannot see them right now. She is requesting prescription for her thyroid medication. The patient also states that she is under a great deal of stress and feels like she is dealing with anxiety. She denies suicidal or homicidal ideations, but just states that she needs a vacation and she just does not feel like she can deal with the stress much longer.      Past Medical History:  Diagnosis Date  . Anxiety   . Depression   . Insomnia   . Recurrent boils   . Sciatica   . Thyroid disease     There are no active problems to display for this patient.   Past Surgical History:  Procedure Laterality Date  . TUBAL LIGATION      OB History    No data available       Home Medications    Prior to Admission medications   Medication Sig Start Date End Date Taking? Authorizing Provider  cyclobenzaprine (FLEXERIL) 10 MG tablet Take 1  tablet (10 mg total) by mouth 3 (three) times daily as needed. 04/25/15   Triplett, Tammy, PA-C  fluconazole (DIFLUCAN) 150 MG tablet Take 1 tablet (150 mg total) by mouth daily. 12/03/13   Janne Napoleon, NP  HYDROcodone-acetaminophen (NORCO/VICODIN) 5-325 MG per tablet Take 1 tablet by mouth every 4 (four) hours as needed. 12/03/13   Janne Napoleon, NP  oxyCODONE-acetaminophen (PERCOCET/ROXICET) 5-325 MG tablet Take 1 tablet by mouth every 4 (four) hours as needed. 04/25/15   Triplett, Tammy, PA-C  predniSONE (DELTASONE) 10 MG tablet Take 6 tablets day one, 5 tablets day two, 4 tablets day three, 3 tablets day four, 2 tablets day five, then 1 tablet day six 04/25/15   Pauline Aus, PA-C    Family History History reviewed. No pertinent family history.  Social History Social History  Substance Use Topics  . Smoking status: Current Every Day Smoker    Packs/day: 1.00    Types: Cigarettes  . Smokeless tobacco: Never Used  . Alcohol use No     Allergies   Bactrim [sulfamethoxazole-trimethoprim]   Review of Systems Review of Systems  Constitutional: Negative for activity change.       All ROS Neg except as noted in HPI  HENT: Negative for nosebleeds.   Eyes: Negative for  photophobia and discharge.  Respiratory: Negative for cough, shortness of breath and wheezing.   Cardiovascular: Negative for chest pain and palpitations.  Gastrointestinal: Negative for abdominal pain and blood in stool.  Genitourinary: Negative for dysuria, frequency and hematuria.  Musculoskeletal: Positive for arthralgias. Negative for back pain and neck pain.  Skin: Negative.   Neurological: Positive for weakness. Negative for dizziness, seizures and speech difficulty.  Psychiatric/Behavioral: Negative for confusion and hallucinations. The patient is nervous/anxious.      Physical Exam Updated Vital Signs BP 112/88 (BP Location: Right Arm)   Pulse 98   Temp 98.1 F (36.7 C) (Oral)   Resp 18   Ht   (1.6 m)   Wt 86.2 kg (190 lb)   LMP 11/26/2016   SpO2 96%   BMI 33.66 kg/m   Physical Exam  Constitutional: She appears well-developed and well-nourished. No distress.  HENT:  Head: Normocephalic and atraumatic.  Right Ear: External ear normal.  Left Ear: Left ear exhibits lacerations.  Scratch of the EAC with small amount of clotted blood.   Eyes: Conjunctivae are normal. Right eye exhibits no discharge. Left eye exhibits no discharge. No scleral icterus.  Neck: Neck supple. No tracheal deviation present.  Cardiovascular: Normal rate, regular rhythm and intact distal pulses.   Pulmonary/Chest: Effort normal and breath sounds normal. No stridor. No respiratory distress. She has no wheezes. She has no rales.  Abdominal: Soft. Bowel sounds are normal. She exhibits no distension. There is no tenderness. There is no rebound and no guarding.  Musculoskeletal: She exhibits tenderness. She exhibits no edema.       Left ankle: She exhibits no deformity. Tenderness. Medial malleolus tenderness found. Achilles tendon normal.       Feet:  Left ankle is warm but not hot.  Neurological: She is alert. She has normal strength. No cranial nerve deficit (no facial droop, extraocular movements intact, no slurred speech) or sensory deficit. She exhibits normal muscle tone. She displays no seizure activity. Coordination normal.  Pt is ambulatory without problem.  Skin: Skin is warm and dry. No rash noted.  Psychiatric: She has a normal mood and affect.  Nursing note and vitals reviewed.    ED Treatments / Results  Labs (all labs ordered are listed, but only abnormal results are displayed) Labs Reviewed - No data to display  EKG  EKG Interpretation None       Radiology No results found.  Procedures Procedures (including critical care time)  Medications Ordered in ED Medications - No data to display   Initial Impression / Assessment and Plan / ED Course  I have reviewed the triage  vital signs and the nursing notes.  Pertinent labs & imaging results that were available during my care of the patient were reviewed by me and considered in my medical decision making (see chart for details).       Final Clinical Impressions(s) / ED Diagnoses MDM Vital signs within normal limits. X-ray of the ankle shows calcium deposits and a possible syndesmotic injury. No gross neurovascular deficits appreciated. The patient will be fitted with an ankle stirrup splint. Patient is asked to use Decadron and diclofenac daily. She is asked to use Tylenol in between the doses if needed. Pt has a scratch of the EAC from Q-tip and finger nail. Cortisporin given for pt to use.  Patient was given a prescription for levothyroxine 100 g. Patient states this is those of medication that she's been taking for her thyroid. I  strongly encouraged patient to see the physicians at the free clinic to have her thyroid monitored, and her weakness monitored. Pt in agreement with plan.   Final diagnoses:  Syndesmotic ankle sprain, left, sequela  Weakness    New Prescriptions New Prescriptions   DEXAMETHASONE (DECADRON) 4 MG TABLET    Take 1 tablet (4 mg total) by mouth 2 (two) times daily with a meal.   DICLOFENAC (VOLTAREN) 75 MG EC TABLET    Take 1 tablet (75 mg total) by mouth 2 (two) times daily.   LEVOTHYROXINE (SYNTHROID, LEVOTHROID) 100 MCG TABLET    Take 1 tablet (100 mcg total) by mouth daily before breakfast.     Ivery Quale, PA-C 11/26/16 1059    Ivery Quale, PA-C 11/26/16 1109    Bethann Berkshire, MD 11/27/16 [phone]

## 2016-11-26 NOTE — ED Triage Notes (Signed)
Pt states out of thyroid med x 6 months due to finances. States intermittent weakness and feeling tired x 6 months. Also injured left ankle 2-3 months ago and is still swollen. Swelling to left ankle. Ntoed. Nad. Pt ambulated to room without difficulty. A/o

## 2016-11-26 NOTE — Discharge Instructions (Signed)
The x-ray of your ankle reveals calcium deposits in some of the tendon/ligament areas. This is consistent with injury that you discussed that happen approximately 2 or 3 months ago. It appears that you are developing inflammation around the injury area. No new fracture is seen. Please use the ankle stirrup splint, apply heat, use the Decadron and diclofenac daily. Please see the physicians at the free clinic to monitor your weakness,and supervise your thyroid treatment.

## 2016-11-26 NOTE — ED Notes (Signed)
Pt is concerned that she has not had her thyroid medication in a while and has been feeling weak

## 2017-06-17 ENCOUNTER — Ambulatory Visit (INDEPENDENT_AMBULATORY_CARE_PROVIDER_SITE_OTHER): Payer: Self-pay | Admitting: Orthopedic Surgery

## 2017-06-17 ENCOUNTER — Encounter: Payer: Self-pay | Admitting: Orthopedic Surgery

## 2017-06-17 ENCOUNTER — Telehealth: Payer: Self-pay | Admitting: Radiology

## 2017-06-17 VITALS — BP 114/72 | HR 89 | Ht 63.0 in | Wt 183.0 lb

## 2017-06-17 DIAGNOSIS — M25472 Effusion, left ankle: Secondary | ICD-10-CM

## 2017-06-17 DIAGNOSIS — M25572 Pain in left ankle and joints of left foot: Secondary | ICD-10-CM

## 2017-06-17 DIAGNOSIS — G8929 Other chronic pain: Secondary | ICD-10-CM

## 2017-06-17 MED ORDER — PREDNISONE 10 MG PO TABS: 10.0000 mg | ORAL_TABLET | Freq: Three times a day (TID) | ORAL | 0 refills | Status: AC

## 2017-06-17 NOTE — Progress Notes (Signed)
NEW PATIENT OFFICE VISIT   Chief Complaint  Patient presents with  . Ankle Injury    Left ankle pain, fell last July 2018. Xrays done in th [facility] on 11-26-16 for the same problem.     MEDICAL DECISION SECTION  xrays ordered? N  My independent reading of xrays: I am interpreting x-ray done on November 26, 2016 for persistent pain and swelling after fall in July 2018  Dystrophic calcification is noted primarily around the posterior aspect of the ankle joint the ankle mortise x-ray is not of excellent quality but appears to show some mild widening of the medial clear space there is ankle joint effusion no fracture  Impression  dystrophic calcification Ankle effusion Questionable widening medial clear space   Encounter Diagnoses  Name Primary?  . Chronic pain of left ankle   . Ankle joint effusion, left Yes     PLAN: Unclear cause of persistent swelling left ankle post trauma in July 2018  Recommend MRI, sed rate C-reactive protein white count and uric acid level   Meds ordered this encounter  Medications  . predniSONE (DELTASONE) 10 MG tablet    Sig: Take 1 tablet (10 mg total) by mouth 3 (three) times daily.    Dispense:  90 tablet    Refill:  0   Injection? NO MRI/CT/? MRI   Chief Complaint  Patient presents with  . Ankle Injury    Left ankle pain, fell last July 2018. Xrays done in th [facility] on 11-26-16 for the same problem.    47 year old female presents for evaluation of persistent left ankle pain and swelling  She injured her ankle in July 2018 did not seek medical treatment until October 2018.  X-rays did not show fracture but possible medial clear space widening.  She was placed on crutches took 5 days of medication and took ibuprofen did not improve presents [facility] as [facility] for evaluation and treatment complaining of dull aching pain of the left foot and ankle for almost 10 months associated with intermittent swelling and painful  weightbearing.  Appears to be some mild giving way symptoms as well   Review of Systems  Constitutional: Negative for chills, fever and weight loss.  Musculoskeletal: Positive for joint pain.  Neurological: Negative for tingling, sensory change, focal weakness and weakness.     Past Medical History:  Diagnosis Date  . Anxiety   . Depression   . Insomnia   . Recurrent boils   . Sciatica   . Thyroid disease     Past Surgical History:  Procedure Laterality Date  . TUBAL LIGATION      No family history on file. Social History   Tobacco Use  . Smoking status: Current Every Day Smoker    Packs/day: 1.00    Types: Cigarettes  . Smokeless tobacco: Never Used  Substance Use Topics  . Alcohol use: No  . Drug use: No    @  No outpatient medications have been marked as taking for the 06/17/17 encounter (Office Visit) with Vickki Hearing, MD.    BP 114/72   Pulse 89   Ht  (1.6 m)   Wt 183 lb (83 kg)   BMI 32.42 kg/m   Physical Exam  Constitutional: She is oriented to person, place, and time. She appears well-developed and well-nourished.  Neurological: She is alert and oriented to person, place, and time.  Psychiatric: She has a normal mood and affect. Judgment normal.  Vitals  reviewed.   Ortho Exam   Her gait is definitely altered with antalgic gait.  She has pain-free range of motion but the joint is warm to touch the skin shows no erythema there is no weakness in plantar flexion dorsiflexion inversion eversion she has a stable drawer.  Her pulse and perfusion are normal the temperature of the joint is warm to touch compared to the left.  Normal sensation is noted reflexes normal no pathologic reflexes  The right ankle is normal in temperature normal range of motion stability strength alignment skin is normal neurovascular exam is intact

## 2017-06-17 NOTE — Telephone Encounter (Signed)
Called advise patient MRI sch for 3 pm on Friday May 3rd. Arrive at 245. Left message for her to call back so I can advise of MRI appt scheduled  Dr Romeo Apple stated he will call her back with the MRI report. And lab report

## 2017-06-20 ENCOUNTER — Ambulatory Visit (HOSPITAL_COMMUNITY): Admission: RE | Admit: 2017-06-20 | Payer: Self-pay | Source: Ambulatory Visit

## 2018-06-18 ENCOUNTER — Other Ambulatory Visit: Payer: Self-pay

## 2018-06-18 ENCOUNTER — Encounter (HOSPITAL_COMMUNITY): Payer: Self-pay

## 2018-06-18 ENCOUNTER — Emergency Department (HOSPITAL_COMMUNITY)
Admission: EM | Admit: 2018-06-18 | Discharge: 2018-06-19 | Disposition: A | Discharge status: Home / Self Care | Payer: Self-pay | Attending: Emergency Medicine | Admitting: Emergency Medicine

## 2018-06-18 DIAGNOSIS — F319 Bipolar disorder, unspecified: Secondary | ICD-10-CM | POA: Insufficient documentation

## 2018-06-18 DIAGNOSIS — F419 Anxiety disorder, unspecified: Secondary | ICD-10-CM | POA: Insufficient documentation

## 2018-06-18 DIAGNOSIS — R17 Unspecified jaundice: Secondary | ICD-10-CM | POA: Insufficient documentation

## 2018-06-18 DIAGNOSIS — R4689 Other symptoms and signs involving appearance and behavior: Secondary | ICD-10-CM | POA: Diagnosis present

## 2018-06-18 DIAGNOSIS — F209 Schizophrenia, unspecified: Secondary | ICD-10-CM | POA: Insufficient documentation

## 2018-06-18 DIAGNOSIS — F1910 Other psychoactive substance abuse, uncomplicated: Secondary | ICD-10-CM | POA: Insufficient documentation

## 2018-06-18 DIAGNOSIS — F17210 Nicotine dependence, cigarettes, uncomplicated: Secondary | ICD-10-CM | POA: Insufficient documentation

## 2018-06-18 DIAGNOSIS — N289 Disorder of kidney and ureter, unspecified: Secondary | ICD-10-CM | POA: Insufficient documentation

## 2018-06-18 DIAGNOSIS — Z79899 Other long term (current) drug therapy: Secondary | ICD-10-CM | POA: Insufficient documentation

## 2018-06-18 DIAGNOSIS — D649 Anemia, unspecified: Secondary | ICD-10-CM | POA: Insufficient documentation

## 2018-06-18 HISTORY — DX: Bipolar disorder, unspecified: F31.9

## 2018-06-18 HISTORY — DX: Schizophrenia, unspecified: F20.9

## 2018-06-18 NOTE — ED Triage Notes (Signed)
Pt brought in by [facility] after family members took out IVC papers on her for acting out, being aggressive with family and friends, brandishing a screwdriver, talking about stabbing people or herself.  Pt having hard time focusing while answering questions, states "people being mean to me".

## 2018-06-19 ENCOUNTER — Encounter (HOSPITAL_COMMUNITY): Payer: Self-pay

## 2018-06-19 DIAGNOSIS — F209 Schizophrenia, unspecified: Secondary | ICD-10-CM

## 2018-06-19 DIAGNOSIS — R4689 Other symptoms and signs involving appearance and behavior: Secondary | ICD-10-CM

## 2018-06-19 LAB — CBC WITH DIFFERENTIAL/PLATELET
Abs Immature Granulocytes: 0.06 10*3/uL (ref 0.00–0.07)
Basophils Absolute: 0.1 10*3/uL (ref 0.0–0.1)
Basophils Relative: 0 %
Eosinophils Absolute: 0.2 10*3/uL (ref 0.0–0.5)
Eosinophils Relative: 1 %
HCT: 35.4 % — ABNORMAL LOW (ref 36.0–46.0)
Hemoglobin: 11.7 g/dL — ABNORMAL LOW (ref 12.0–15.0)
Immature Granulocytes: 0 %
Lymphocytes Relative: 15 %
Lymphs Abs: 2.6 10*3/uL (ref 0.7–4.0)
MCH: 29.6 pg (ref 26.0–34.0)
MCHC: 33.1 g/dL (ref 30.0–36.0)
MCV: 89.6 fL (ref 80.0–100.0)
Monocytes Absolute: 1.8 10*3/uL — ABNORMAL HIGH (ref 0.1–1.0)
Monocytes Relative: 10 %
Neutro Abs: 12.7 10*3/uL — ABNORMAL HIGH (ref 1.7–7.7)
Neutrophils Relative %: 74 %
Platelets: 318 10*3/uL (ref 150–400)
RBC: 3.95 MIL/uL (ref 3.87–5.11)
RDW: 14.8 % (ref 11.5–15.5)
WBC: 17.4 10*3/uL — ABNORMAL HIGH (ref 4.0–10.5)
nRBC: 0 % (ref 0.0–0.2)

## 2018-06-19 LAB — COMPREHENSIVE METABOLIC PANEL
ALT: 18 U/L (ref 0–44)
AST: 36 U/L (ref 15–41)
Albumin: 4.4 g/dL (ref 3.5–5.0)
Alkaline Phosphatase: 49 U/L (ref 38–126)
Anion gap: 16 — ABNORMAL HIGH (ref 5–15)
BUN: 23 mg/dL — ABNORMAL HIGH (ref 6–20)
CO2: 20 mmol/L — ABNORMAL LOW (ref 22–32)
Calcium: 8.9 mg/dL (ref 8.9–10.3)
Chloride: 98 mmol/L (ref 98–111)
Creatinine, Ser: 1.4 mg/dL — ABNORMAL HIGH (ref 0.44–1.00)
GFR calc Af Amer: 52 mL/min — ABNORMAL LOW (ref >60)
GFR calc non Af Amer: 45 mL/min — ABNORMAL LOW (ref >60)
Glucose, Bld: 74 mg/dL (ref 70–99)
Potassium: 4.2 mmol/L (ref 3.5–5.1)
Sodium: 134 mmol/L — ABNORMAL LOW (ref 135–145)
Total Bilirubin: 1.9 mg/dL — ABNORMAL HIGH (ref 0.3–1.2)
Total Protein: 7.7 g/dL (ref 6.5–8.1)

## 2018-06-19 LAB — CBG MONITORING, ED: Glucose-Capillary: 69 mg/dL — ABNORMAL LOW (ref 70–99)

## 2018-06-19 LAB — ETHANOL: Alcohol, Ethyl (B): <10 mg/dL (ref <10)

## 2018-06-19 MED ORDER — HALOPERIDOL LACTATE 5 MG/ML IJ SOLN
5.0000 mg | Freq: Once | INTRAMUSCULAR | Status: AC
  Administered 2018-06-19: 5 mg via INTRAMUSCULAR
  Filled 2018-06-19: qty 1

## 2018-06-19 MED ORDER — ALUM & MAG HYDROXIDE-SIMETH 200-200-20 MG/5ML PO SUSP: 30.0000 mL | Freq: Four times a day (QID) | ORAL | Status: DC | PRN

## 2018-06-19 MED ORDER — LEVOTHYROXINE SODIUM 50 MCG PO TABS
100.0000 ug | ORAL_TABLET | Freq: Every day | ORAL | Status: DC
  Administered 2018-06-19: 100 ug via ORAL
  Filled 2018-06-19: qty 2

## 2018-06-19 MED ORDER — ONDANSETRON HCL 4 MG PO TABS: 4.0000 mg | ORAL_TABLET | Freq: Three times a day (TID) | ORAL | Status: DC | PRN

## 2018-06-19 MED ORDER — NICOTINE 14 MG/24HR TD PT24
14.0000 mg | MEDICATED_PATCH | Freq: Every day | TRANSDERMAL | Status: DC
  Filled 2018-06-19: qty 1

## 2018-06-19 MED ORDER — ACETAMINOPHEN 325 MG PO TABS: 650.0000 mg | ORAL_TABLET | ORAL | Status: DC | PRN

## 2018-06-19 NOTE — ED Notes (Signed)
[facility] called to report Patient meets In-Patient treatment. They currently do not have any beds suitable for her, and will fax her paperwork out to find her an appropriate facility.

## 2018-06-19 NOTE — Progress Notes (Signed)
CSW called and spoke to patient's RN and requested that this writer or TTS be notified when she has had a UDS completed.  Timmothy Euler. Kaylyn Lim, MSW, LCSWA Disposition Clinical Social Work [phone] (cell) [phone] (office)

## 2018-06-19 NOTE — ED Notes (Signed)
Patient asked to give urine sample multiple times as requested by staff.  Patient became belligerent, angry, shouting profanity to staff stating she wasn't going to give a urine sample.  MD made aware.
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
## 2018-06-19 NOTE — ED Notes (Signed)
TTS Cart at Bedside.
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
## 2018-06-19 NOTE — ED Notes (Signed)
Pt provided water to help have to provide urine sample. Pt spilled water on bed and threw cup on floor.

## 2018-06-19 NOTE — ED Notes (Signed)
[facility] to attempt assessment, patient currently still sleeping.

## 2018-06-19 NOTE — Discharge Instructions (Addendum)
Return to the ER if you experience new and/or concerning symptoms.

## 2018-06-19 NOTE — ED Provider Notes (Signed)
Patient seen by TTS and felt to be cleared from a psychiatric standpoint.  Patient also appears medically cleared based on her laboratory studies.  We attempted to obtain a urine sample, however the patient was very uncooperative with this.  She is demanding to be discharged and this request will be granted.  She was also seen by social work with whom she was somewhat uncooperative.  To return as needed for any problems.   Geoffery Lyons, MD 06/19/18 1334

## 2018-06-19 NOTE — ED Notes (Signed)
Pt provided beverages and snacks. Told we need urine sample. Pt unable to provide sample at this time.

## 2018-06-19 NOTE — Progress Notes (Signed)
Per Donell Sievert, PA pt meets criteria for inpt treatment. TTS to seek placement due to no appropriate beds at [facility]. Pt's nurse Sappelt, Will Bonnet, RN and EDP Dione Booze, MD have been advised of the disposition.   Princess Bruins, MSW, LCSW Therapeutic Triage Specialist  [phone]
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
## 2018-06-19 NOTE — ED Notes (Signed)
TTS consult in progress. °

## 2018-06-19 NOTE — BHH Counselor (Signed)
Phone call from pt's father/petitioner requesting update. He was advised pt has been assessed but not placed yet. He requested tox screen results which aren't available at this time. He expects them to be positive due to pt not sleeping for days. Petitioner/father requesting he be contacted on his cell phone instead of home- [phone].

## 2020-04-04 ENCOUNTER — Other Ambulatory Visit (HOSPITAL_COMMUNITY): Payer: Self-pay | Admitting: Diagnostic Radiology

## 2020-04-04 DIAGNOSIS — N63 Unspecified lump in unspecified breast: Secondary | ICD-10-CM

## 2020-04-14 ENCOUNTER — Ambulatory Visit (HOSPITAL_COMMUNITY)
Admission: RE | Admit: 2020-04-14 | Discharge: 2020-04-14 | Disposition: A | Discharge status: Home / Self Care | Payer: Self-pay | Source: Ambulatory Visit | Attending: Diagnostic Radiology | Admitting: Diagnostic Radiology

## 2020-04-14 ENCOUNTER — Other Ambulatory Visit: Payer: Self-pay

## 2020-04-14 DIAGNOSIS — N63 Unspecified lump in unspecified breast: Secondary | ICD-10-CM

## 2021-10-09 IMAGING — MG DIGITAL DIAGNOSTIC BILAT W/ TOMO W/ CAD
6 of 10 series · 6 of 30 positions shown · non-contrast
Comparison: None.

CLINICAL DATA: Patient describes a palpable lump within the upper
LEFT breast for approximately 11 months. This is patient's first
mammogram.

EXAM:
DIGITAL DIAGNOSTIC BILATERAL MAMMOGRAM WITH TOMOSYNTHESIS AND CAD;
ULTRASOUND RIGHT BREAST LIMITED
TECHNIQUE: Bilateral digital diagnostic mammography and breast tomosynthesis
was performed. The images were evaluated with computer-aided
detection.; Targeted ultrasound examination of the right breast was
performed

[L MLO synth-2D (1 of 2)]
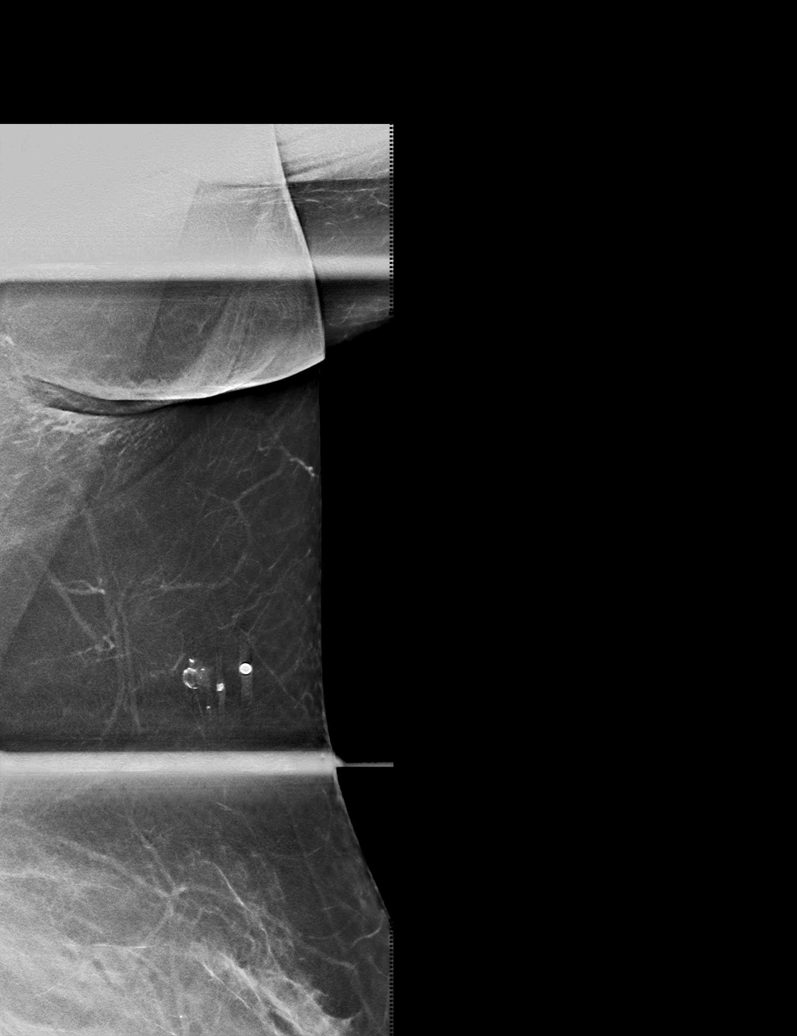

[R CC synth-2D]
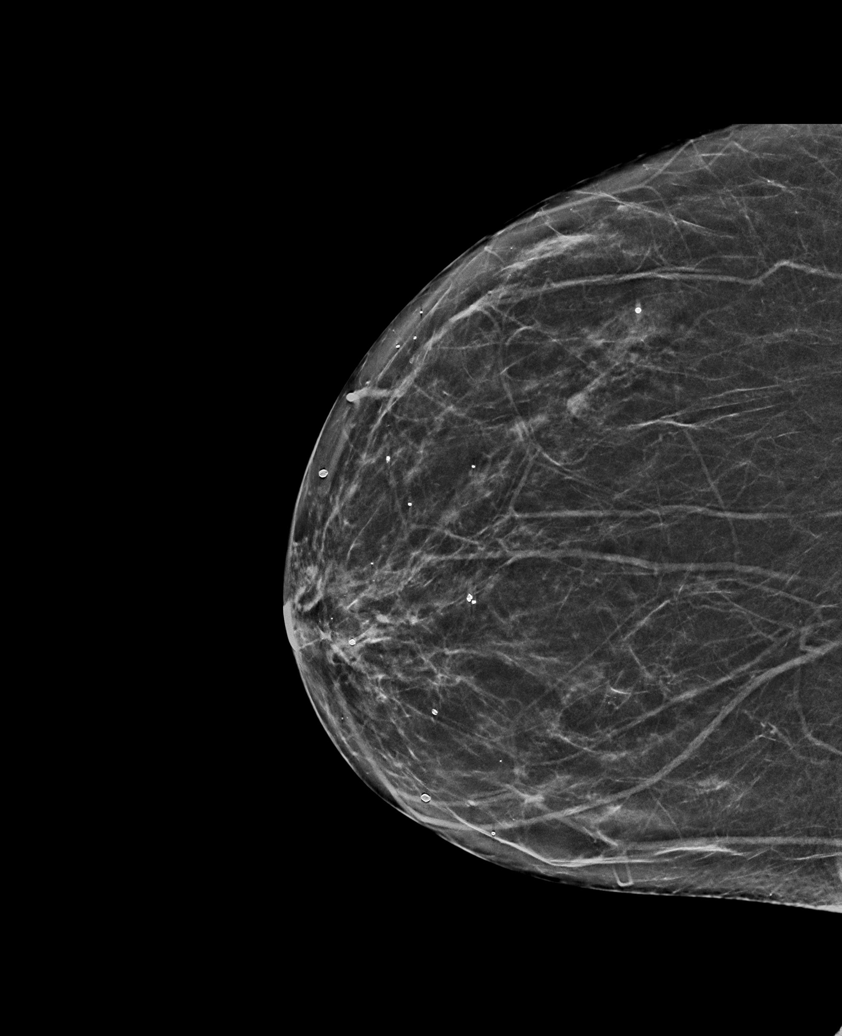

[L CC synth-2D]
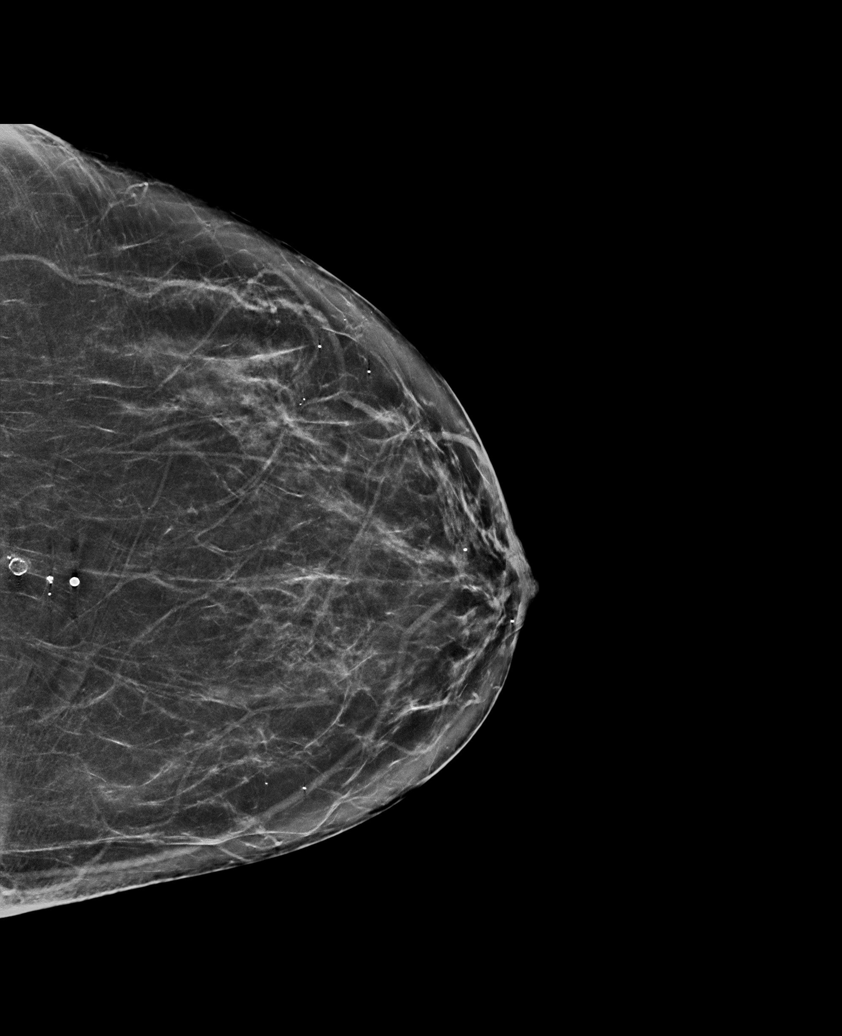

[L MLO synth-2D (2 of 2)]
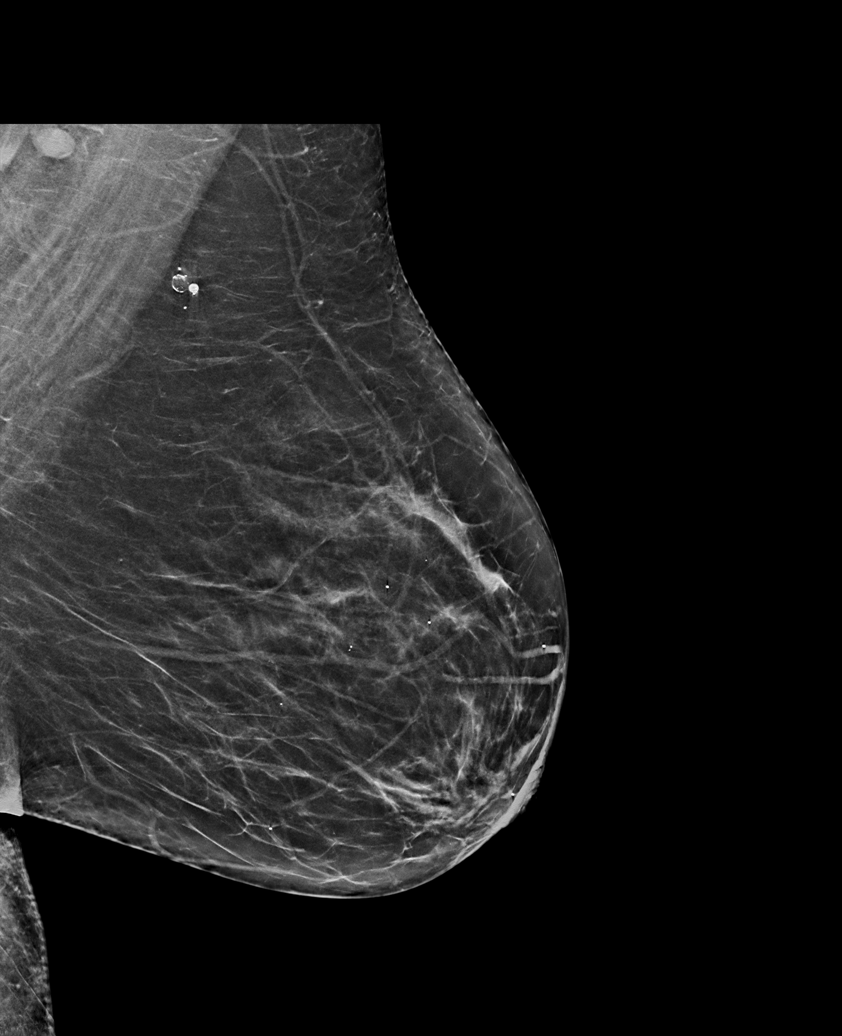

[R MLO synth-2D]
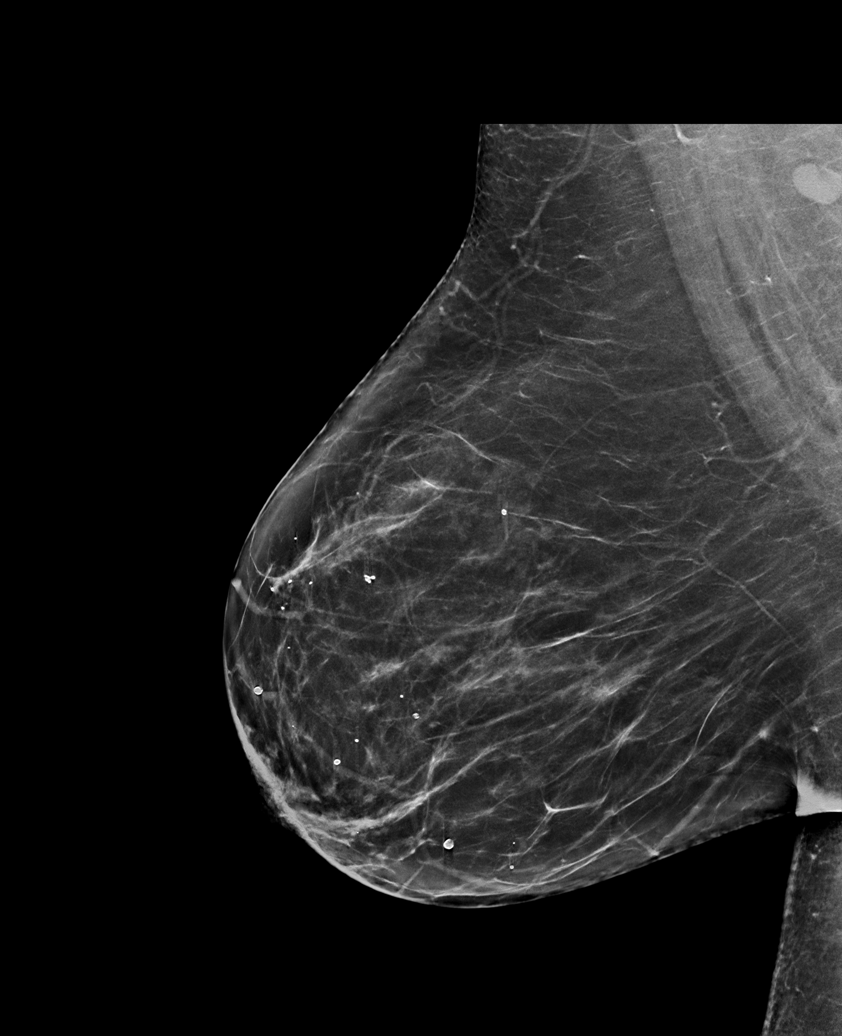

[L MLO tomo · tomo slice 36/71.0]
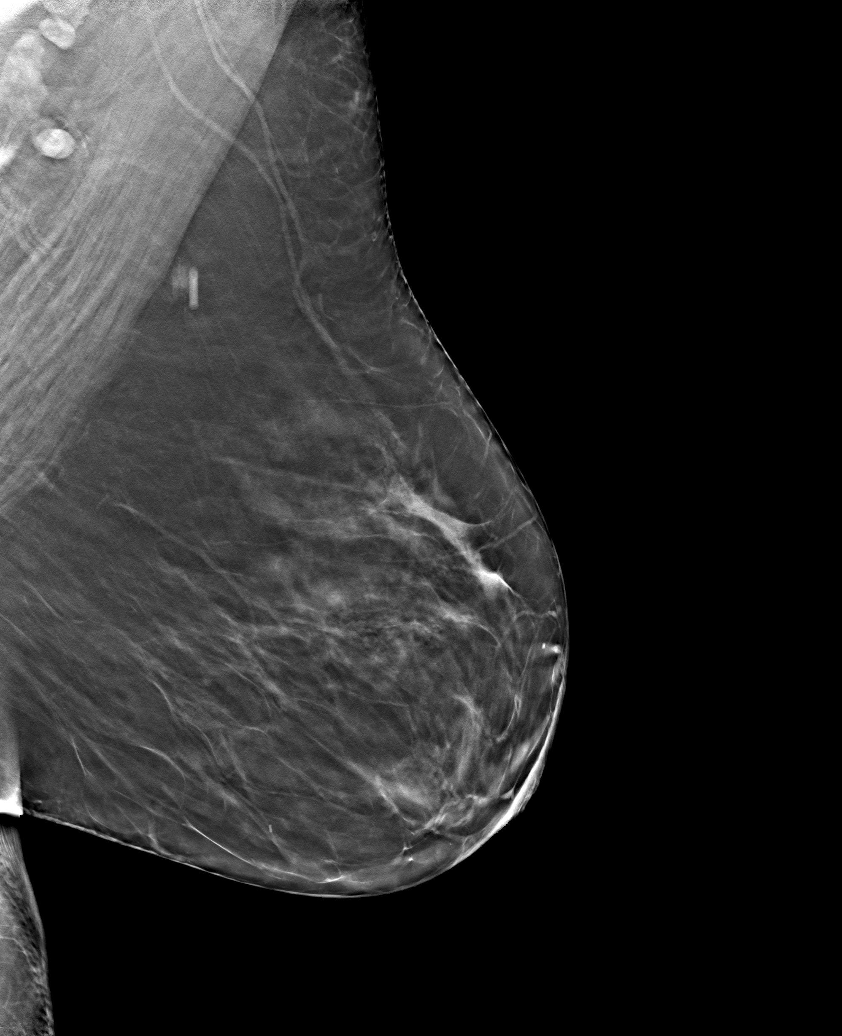

[6 of 30 positions shown; findings below may reference images not displayed]

ACR Breast Density Category b: There are scattered areas of
fibroglandular density.
FINDINGS: LEFT breast: There is a benign oil cyst/fat necrosis within the
upper LEFT breast, corresponding to the palpable area of concern,
with overlying skin marker in place.

There are no dominant masses, suspicious calcifications or secondary
signs of malignancy elsewhere within the LEFT breast.

RIGHT breast: There is an oval circumscribed low-density mass within
the outer RIGHT breast, 9 o'clock axis region, measuring
approximately 6 mm, corresponding as an incidental finding.

There are no dominant masses, suspicious calcifications or secondary
signs of malignancy elsewhere within the RIGHT breast.

RIGHT breast: Targeted ultrasound is performed, showing a benign
cyst in the RIGHT breast at the 9 o'clock axis, 6 cm from the
nipple, measuring 7 mm, corresponding to the incidental mammographic
finding.
IMPRESSION: 1. No evidence of malignancy within either breast.
2. Benign oil cyst/fat necrosis within the upper LEFT breast,
corresponding to the palpable area of concern.
3. Benign cyst within the RIGHT breast at the 9 o'clock axis,
corresponding to the incidental mammographic finding.

RECOMMENDATION:
Screening mammogram in one year.(Code:[id number])

I have discussed the findings and recommendations with the patient.
If applicable, a reminder letter will be sent to the patient
regarding the next appointment.

BI-RADS CATEGORY  2: Benign.

## 2021-11-07 ENCOUNTER — Other Ambulatory Visit (HOSPITAL_COMMUNITY): Payer: Self-pay | Admitting: Sports Medicine

## 2021-11-07 DIAGNOSIS — Z1231 Encounter for screening mammogram for malignant neoplasm of breast: Secondary | ICD-10-CM

## 2021-11-08 ENCOUNTER — Other Ambulatory Visit (HOSPITAL_COMMUNITY): Payer: Self-pay | Admitting: Obstetrics and Gynecology

## 2021-11-08 DIAGNOSIS — Z1231 Encounter for screening mammogram for malignant neoplasm of breast: Secondary | ICD-10-CM

## 2021-12-28 ENCOUNTER — Inpatient Hospital Stay: Payer: Self-pay | Attending: Obstetrics and Gynecology

## 2021-12-28 ENCOUNTER — Ambulatory Visit (HOSPITAL_COMMUNITY): Payer: Self-pay

## 2023-02-27 ENCOUNTER — Other Ambulatory Visit (HOSPITAL_COMMUNITY): Payer: Self-pay | Admitting: Internal Medicine

## 2023-02-27 DIAGNOSIS — Z1231 Encounter for screening mammogram for malignant neoplasm of breast: Secondary | ICD-10-CM

## 2023-03-06 ENCOUNTER — Ambulatory Visit (HOSPITAL_COMMUNITY): Payer: Self-pay

## 2023-03-25 ENCOUNTER — Telehealth: Payer: Self-pay

## 2023-03-25 NOTE — Telephone Encounter (Signed)
 Reviewing records of [facility] client and noted with her provider [facility] that she has Medicaid. Confirmed with [employer] that she does have [employer].  Attempted wellness call and no answer, left message requesting return call.   Avelina JONELLE Skeen RN [employer]

## 2023-05-28 ENCOUNTER — Encounter (INDEPENDENT_AMBULATORY_CARE_PROVIDER_SITE_OTHER): Payer: Self-pay | Admitting: *Deleted

## 2023-11-27 ENCOUNTER — Encounter (INDEPENDENT_AMBULATORY_CARE_PROVIDER_SITE_OTHER): Payer: Self-pay | Admitting: *Deleted
